# Patient Record
Sex: Female | Born: 1985 | Race: White | Hispanic: No | Marital: Married | Smoking: Never smoker
Health system: Southern US, Community
[De-identification: ages and names within clinical notes are randomized; demographics above are authoritative.]

## PROBLEM LIST (undated history)

## (undated) ENCOUNTER — Inpatient Hospital Stay (HOSPITAL_COMMUNITY): Payer: Self-pay

## (undated) DIAGNOSIS — T4145XA Adverse effect of unspecified anesthetic, initial encounter: Secondary | ICD-10-CM

## (undated) DIAGNOSIS — Z789 Other specified health status: Secondary | ICD-10-CM

## (undated) DIAGNOSIS — T8859XA Other complications of anesthesia, initial encounter: Secondary | ICD-10-CM

## (undated) HISTORY — PX: WISDOM TOOTH EXTRACTION: SHX21

---
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
## 2003-07-05 ENCOUNTER — Other Ambulatory Visit: Admission: RE | Admit: 2003-07-05 | Discharge: 2003-07-05 | Payer: Self-pay | Admitting: Family Medicine

## 2004-04-11 ENCOUNTER — Encounter: Admission: RE | Admit: 2004-04-11 | Discharge: 2004-04-11 | Payer: Self-pay | Admitting: Family Medicine

## 2005-01-09 ENCOUNTER — Other Ambulatory Visit: Admission: RE | Admit: 2005-01-09 | Discharge: 2005-01-09 | Payer: Self-pay | Admitting: Family Medicine

## 2006-02-01 ENCOUNTER — Other Ambulatory Visit: Admission: RE | Admit: 2006-02-01 | Discharge: 2006-02-01 | Payer: Self-pay | Admitting: Family Medicine

## 2007-02-11 ENCOUNTER — Other Ambulatory Visit: Admission: RE | Admit: 2007-02-11 | Discharge: 2007-02-11 | Payer: Self-pay | Admitting: Family Medicine

## 2007-03-19 ENCOUNTER — Encounter: Admission: RE | Admit: 2007-03-19 | Discharge: 2007-03-19 | Payer: Self-pay | Admitting: Family Medicine

## 2008-02-19 ENCOUNTER — Other Ambulatory Visit: Admission: RE | Admit: 2008-02-19 | Discharge: 2008-02-19 | Payer: Self-pay | Admitting: Family Medicine

## 2008-11-03 ENCOUNTER — Inpatient Hospital Stay (HOSPITAL_COMMUNITY): Admission: AD | Admit: 2008-11-03 | Discharge: 2008-11-06 | Payer: Self-pay | Admitting: Obstetrics and Gynecology

## 2008-11-04 ENCOUNTER — Encounter (INDEPENDENT_AMBULATORY_CARE_PROVIDER_SITE_OTHER): Payer: Self-pay | Admitting: Obstetrics and Gynecology

## 2010-12-26 LAB — DIFFERENTIAL
Basophils Absolute: 0.1 10*3/uL (ref 0.0–0.1)
Lymphocytes Relative: 20 % (ref 12–46)
Neutro Abs: 13.6 10*3/uL — ABNORMAL HIGH (ref 1.7–7.7)
Neutrophils Relative %: 75 % (ref 43–77)

## 2010-12-26 LAB — CBC
Platelets: 198 10*3/uL (ref 150–400)
RDW: 12.8 % (ref 11.5–15.5)

## 2010-12-26 LAB — RPR: RPR Ser Ql: NONREACTIVE

## 2012-10-03 ENCOUNTER — Inpatient Hospital Stay (HOSPITAL_COMMUNITY)
Admission: AD | Admit: 2012-10-03 | Discharge: 2012-10-03 | Disposition: A | Discharge status: Home / Self Care | Payer: Medicaid Other | Source: Ambulatory Visit | Attending: Obstetrics & Gynecology | Admitting: Obstetrics & Gynecology

## 2012-10-03 ENCOUNTER — Encounter (HOSPITAL_COMMUNITY): Payer: Self-pay | Admitting: *Deleted

## 2012-10-03 ENCOUNTER — Inpatient Hospital Stay (HOSPITAL_COMMUNITY): Payer: Medicaid Other

## 2012-10-03 DIAGNOSIS — O039 Complete or unspecified spontaneous abortion without complication: Secondary | ICD-10-CM

## 2012-10-03 DIAGNOSIS — O200 Threatened abortion: Secondary | ICD-10-CM | POA: Insufficient documentation

## 2012-10-03 LAB — CBC
MCH: 29.6 pg (ref 26.0–34.0)
Platelets: 396 10*3/uL (ref 150–400)
RBC: 4.19 MIL/uL (ref 3.87–5.11)
WBC: 18.4 10*3/uL — ABNORMAL HIGH (ref 4.0–10.5)

## 2012-10-03 MED ORDER — LACTATED RINGERS IV SOLN
INTRAVENOUS | Status: DC
  Administered 2012-10-03: 15:00:00 via INTRAVENOUS

## 2012-10-03 MED ORDER — LACTATED RINGERS IV SOLN: INTRAVENOUS | Status: DC

## 2012-10-03 MED ORDER — PROMETHAZINE HCL 25 MG/ML IJ SOLN
12.5000 mg | Freq: Once | INTRAMUSCULAR | Status: AC
  Administered 2012-10-03: 12.5 mg via INTRAVENOUS
  Filled 2012-10-03: qty 1

## 2012-10-03 NOTE — MAU Note (Signed)
Pt states miscarriage began yesterday morning, was able to sleep a small amount. Many clots noted both small and large. Hx prior miscarriage. No pain at present. Pain with passing clots only. Had syncopal episode at home and husband brought pt here, EMS was leaving and was able to bring pt via stretcher into MAU room 1.

## 2012-10-03 NOTE — MAU Provider Note (Signed)
History     CSN: [id number]  Arrival date and time: 10/03/12 1304   First Provider Initiated Contact with Patient 10/03/12 1416      Chief Complaint  Patient presents with  . Miscarriage  . Syncope    HPI This is a 27 y.o. female at [redacted]w[redacted]d by LMP who presents via EMS with C/O heavy bleeding with clots starting yesterday. Today had syncopal episode and weakness. Some cramping. Has had a miscarriage before and it was like this. Has had two other full term deliveries. Feels weak and dizzy when sits up.   RN Note: Pt states miscarriage began yesterday morning, was able to sleep a small amount. Many clots noted both small and large. Hx prior miscarriage. No pain at present. Pain with passing clots only. Had syncopal episode at home and husband brought pt here, EMS was leaving and was able to bring pt via stretcher into MAU room 1.       OB History    Grav Para Term Preterm Abortions TAB SAB Ect Mult Living   4 2 2  0 1 0 1 0 0 2      No past medical history on file.  No past surgical history on file.  No family history on file.  History  Substance Use Topics  . Smoking status: Not on file  . Smokeless tobacco: Not on file  . Alcohol Use: Not on file    Allergies:  Allergies  Allergen Reactions  . Amoxil (Amoxicillin) Rash    Prescriptions prior to admission  Medication Sig Dispense Refill  . ALFALFA PO Take 1 tablet by mouth daily.      Marland Kitchen GRAPE SEED CR PO Take 1 tablet by mouth daily.        Review of Systems  Constitutional: Positive for malaise/fatigue. Negative for fever and chills.  Eyes: Negative for blurred vision.  Cardiovascular: Negative for chest pain.  Gastrointestinal: Positive for nausea, vomiting and abdominal pain. Negative for diarrhea and constipation.  Genitourinary:       Heavy vaginal bleeding   Neurological: Positive for weakness. Negative for headaches.   Physical Exam   Blood pressure 117/84, pulse 133, temperature 98 F (36.7 C),  temperature source Oral, resp. rate 18, height 5' 3.5" (1.613 m), weight 63.504 kg (140 lb), last menstrual period 07/18/2012.  Physical Exam  Constitutional: She is oriented to person, place, and time. She appears well-developed and well-nourished. No distress.  Cardiovascular: Normal rate.   Respiratory: Effort normal.  GI: Soft. She exhibits no distension and no mass. There is tenderness. There is no rebound and no guarding.  Musculoskeletal: Normal range of motion. She exhibits no edema.  Neurological: She is alert and oriented to person, place, and time.  Skin: Skin is warm and dry.  Psychiatric: She has a normal mood and affect.    MAU Course  Procedures  MDM Labs ordered, as was IV.  Patient felt nauseated and dry-heaved. At that time she felt faint and her pulse ox said her heart rate was 42.  When I auscultated her heart, rate was 100s. Skin is pale and hands are cold, suspect poor perfusion. Will defer pelvic exam until after IVF infused. Will get 12 lead EKG to be sure about heart rate. >>  Exam:  Clot in cervix, pulled some out but unable tofully evacuate. No active bleeding. US showed:    Assessment and Plan  A:  Pregnancy at 11 weeks      SAB in progress,  clot in cervix  P:  Discussed with Dr Collene Mares until patient can tolerate sitting up      May consider/offer Cytotec to fully evacuate clots      Report to S. Chase Picket NP   Wynelle Bourgeois 10/03/2012, 2:34 PM

## 2014-02-22 ENCOUNTER — Encounter (HOSPITAL_COMMUNITY): Payer: Self-pay | Admitting: *Deleted

## 2014-02-22 ENCOUNTER — Inpatient Hospital Stay (HOSPITAL_COMMUNITY)
Admission: AD | Admit: 2014-02-22 | Discharge: 2014-02-22 | Disposition: A | Discharge status: Home / Self Care | Payer: Medicaid Other | Source: Ambulatory Visit | Attending: Obstetrics & Gynecology | Admitting: Obstetrics & Gynecology

## 2014-02-22 DIAGNOSIS — O9989 Other specified diseases and conditions complicating pregnancy, childbirth and the puerperium: Principal | ICD-10-CM

## 2014-02-22 DIAGNOSIS — R109 Unspecified abdominal pain: Secondary | ICD-10-CM | POA: Insufficient documentation

## 2014-02-22 DIAGNOSIS — O99891 Other specified diseases and conditions complicating pregnancy: Secondary | ICD-10-CM | POA: Insufficient documentation

## 2014-02-22 DIAGNOSIS — O26899 Other specified pregnancy related conditions, unspecified trimester: Secondary | ICD-10-CM

## 2014-02-22 HISTORY — DX: Other complications of anesthesia, initial encounter: T88.59XA

## 2014-02-22 HISTORY — DX: Adverse effect of unspecified anesthetic, initial encounter: T41.45XA

## 2014-02-22 HISTORY — DX: Other specified health status: Z78.9

## 2014-02-22 LAB — URINALYSIS, ROUTINE W REFLEX MICROSCOPIC
BILIRUBIN URINE: NEGATIVE
GLUCOSE, UA: NEGATIVE mg/dL
Ketones, ur: NEGATIVE mg/dL
Nitrite: NEGATIVE
PROTEIN: NEGATIVE mg/dL
Specific Gravity, Urine: <1.005 — ABNORMAL LOW (ref 1.005–1.030)
UROBILINOGEN UA: 0.2 mg/dL (ref 0.0–1.0)
pH: 5.5 (ref 5.0–8.0)

## 2014-02-22 LAB — URINE MICROSCOPIC-ADD ON

## 2014-02-22 NOTE — Discharge Instructions (Signed)
Abdominal Pain During Pregnancy °Abdominal pain is common in pregnancy. Most of the time, it does not cause harm. There are many causes of abdominal pain. Some causes are more serious than others. Some of the causes of abdominal pain in pregnancy are easily diagnosed. Occasionally, the diagnosis takes time to understand. Other times, the cause is not determined. Abdominal pain can be a sign that something is very wrong with the pregnancy, or the pain may have nothing to do with the pregnancy at all. For this reason, always tell your health care provider if you have any abdominal discomfort. °HOME CARE INSTRUCTIONS  °Monitor your abdominal pain for any changes. The following actions may help to alleviate any discomfort you are experiencing: °· Do not have sexual intercourse or put anything in your vagina until your symptoms go away completely. °· Get plenty of rest until your pain improves. °· Drink clear fluids if you feel nauseous. Avoid solid food as long as you are uncomfortable or nauseous. °· Only take over-the-counter or prescription medicine as directed by your health care provider. °· Keep all follow-up appointments with your health care provider. °SEEK IMMEDIATE MEDICAL CARE IF: °· You are bleeding, leaking fluid, or passing tissue from the vagina. °· You have increasing pain or cramping. °· You have persistent vomiting. °· You have painful or bloody urination. °· You have a fever. °· You notice a decrease in your baby's movements. °· You have extreme weakness or feel faint. °· You have shortness of breath, with or without abdominal pain. °· You develop a severe headache with abdominal pain. °· You have abnormal vaginal discharge with abdominal pain. °· You have persistent diarrhea. °· You have abdominal pain that continues even after rest, or gets worse. °MAKE SURE YOU:  °· Understand these instructions. °· Will watch your condition. °· Will get help right away if you are not doing well or get  worse. °Document Released: 08/27/2005 Document Revised: 06/17/2013 Document Reviewed: 03/26/2013 °Exit[employer]® Patient Information ©2014 [employer]. ° °

## 2014-02-22 NOTE — MAU Note (Signed)
Pt. Went swimming today and pt. Felt a dull ache on the rt. Lower abdomen.  She went inside and then she had a constant sharp shooting pain on her rt. Lower abdomen, she was unable to move because of the pain.  Pt. Says that the pain has eased up right now, but she can still feel it.

## 2014-02-22 NOTE — MAU Provider Note (Signed)
Chief Complaint:  Abdominal Pain   Kim Mccormick is a 28 y.o.  [phone] with IUP at 4078w6d presenting for Abdominal Pain  Pt states she was at the pool today with her kids and thinks she may have overdone it. Started out with a dull right sided pain in her side and that pain started increasing and became more sharp.  Was bad enough that she struggled to get in the car to come in.  On the way here, pain subsided and is now almost gone.  Still just has occasional pains but minor compared to earlier.    No ctx, lof, vb. +FM.     Menstrual History: OB History   Grav Para Term Preterm Abortions TAB SAB Ect Mult Living   5 2 2  0 2 0 2 0 0 2      Patient's last menstrual period was 07/28/2013.      Past Medical History  Diagnosis Date  . Complication of anesthesia   . Medical history non-contributory     Past Surgical History  Procedure Laterality Date  . Wisdom tooth extraction      No family history on file.  History  Substance Use Topics  . Smoking status: Never Smoker   . Smokeless tobacco: Not on file  . Alcohol Use: No      Allergies  Allergen Reactions  . Amoxil [Amoxicillin] Rash    Prescriptions prior to admission  Medication Sig Dispense Refill  . ALFALFA PO Take 1 tablet by mouth daily.      Marland Kitchen. GRAPE SEED CR PO Take 1 tablet by mouth daily.        Review of Systems - Negative except for what is mentioned in HPI.  Physical Exam  Blood pressure 123/78, pulse 92, temperature 98.2 F (36.8 C), temperature source Oral, last menstrual period 07/28/2013, not currently breastfeeding. GENERAL: Well-developed, well-nourished female in no acute distress.  LUNGS: Clear to auscultation bilaterally.  HEART: Regular rate and rhythm. ABDOMEN: Soft, nontender, nondistended, gravid.  EXTREMITIES: Nontender, no edema, 2+ distal pulses. Cervical Exam: Dilatation 0cm   Effacement thick %   Station high   Presentation: cephalic FHT:  Baseline rate 130 bpm   Variability  moderate  Accelerations present   Decelerations none Contractions: quiet   Labs: No results found for this or any previous visit (from the past 24 hour(s)).  Imaging Studies:  No results found.  Assessment: Kim Mccormick is  28 y.o. [id number] at 178w6d presents with Abdominal Pain .  Plan:  - abdominal pain now almost completely resolved.  - likely more msk in the setting of overexertion and now with improvement after completion of activity - UA pending - pt comfortable with no further workup.  - cervix closed/thick/high - discussed reasons to return including return of pain or worsening.    F/u as already scheduled with OB in [place].    Kim Mccormick 6/15/201510:50 PM

## 2014-02-22 NOTE — MAU Provider Note (Signed)
Attestation of Attending Supervision of Advanced Practitioner (CNM/NP): Evaluation and management procedures were performed by the Advanced Practitioner under my supervision and collaboration.  I have reviewed the Advanced Practitioner's note and chart, and I agree with the management and plan.  HARRAWAY-SMITH, Shakhia Gramajo 11:08 PM

## 2014-07-12 ENCOUNTER — Encounter (HOSPITAL_COMMUNITY): Payer: Self-pay | Admitting: *Deleted

## 2014-12-28 ENCOUNTER — Encounter (HOSPITAL_COMMUNITY): Payer: Self-pay | Admitting: *Deleted

## 2017-09-10 DIAGNOSIS — N200 Calculus of kidney: Secondary | ICD-10-CM

## 2017-09-10 HISTORY — DX: Calculus of kidney: N20.0

## 2018-05-20 ENCOUNTER — Ambulatory Visit
Admission: RE | Admit: 2018-05-20 | Discharge: 2018-05-20 | Disposition: A | Discharge status: Home / Self Care | Payer: Medicaid Other | Source: Ambulatory Visit | Attending: Family Medicine | Admitting: Family Medicine

## 2018-05-20 ENCOUNTER — Other Ambulatory Visit: Payer: Self-pay | Admitting: Family Medicine

## 2018-05-20 DIAGNOSIS — M25541 Pain in joints of right hand: Secondary | ICD-10-CM

## 2021-05-09 ENCOUNTER — Encounter: Payer: Self-pay | Admitting: Gastroenterology

## 2021-06-13 ENCOUNTER — Encounter: Payer: Self-pay | Admitting: Gastroenterology

## 2021-06-13 ENCOUNTER — Ambulatory Visit: Payer: Medicaid Other | Admitting: Gastroenterology

## 2021-06-13 VITALS — BP 100/80 | HR 94 | Ht 63.0 in | Wt 159.0 lb

## 2021-06-13 DIAGNOSIS — L290 Pruritus ani: Secondary | ICD-10-CM

## 2021-06-13 DIAGNOSIS — K625 Hemorrhage of anus and rectum: Secondary | ICD-10-CM

## 2021-06-13 NOTE — Progress Notes (Signed)
Called Dr. Wendee Copp office to request most recent labs be faxed to our office. LVM with Marylene Land with our fax # to fax requested records as well as our office phone # should she have any further questions.

## 2021-06-13 NOTE — Patient Instructions (Addendum)
It was my pleasure to provide care to you today. Based on our discussion, I am providing you with my recommendations below:  RECOMMENDATION(S):   I am sending you with samples of Recticare. You may purchase this products Over the Counter  * Keep your bottom clean * Avoid moisture in the anal area * Apply zinc oxide to the anal area * Avoid tight fitting clothes * Wear cotton underpants * Avoid coffee, cola, beer, tomatoes, chocolate, tea, and citrus fruits as these can exacerbate anal itching   I have recommended a colonoscopy for further evaluation  COLONOSCOPY:   You have been scheduled for a colonoscopy. Please follow written instructions given to you at your visit today.   PREP:   Please pick up your prep supplies at the pharmacy within the next 1-3 days.  INHALERS:   If you use inhalers (even only as needed), please bring them with you on the day of your procedure.  COLONOSCOPY TIPS:  To reduce nausea and dehydration, stay well hydrated for 3-4 days prior to the exam.  To prevent skin/hemorrhoid irritation - prior to wiping, put A&Dointment or vaseline on the toilet paper. Keep a towel or pad on the bed.  BEFORE STARTING YOUR PREP, drink  64oz of clear liquids in the morning. This will help to flush the colon and will ensure you are well hydrated!!!!  NOTE - This is in addition to the fluids required for to complete your prep. Use of a flavored hard candy, such as grape Rubin Payor, can counteract some of the flavor of the prep and may prevent some nausea.    FOLLOW UP:  After your procedure, you will receive a call from my office staff regarding my recommendation for follow up.  BMI:  If you are age 57 or younger, your body mass index should be between 19-25. Your Body mass index is 28.17 kg/m. If this is out of the aformentioned range listed, please consider follow up with your Primary Care Provider.   MY CHART:  The [facility] providers would like to encourage  you to use [facility] to communicate with providers for non-urgent requests or questions.  Due to long hold times on the telephone, sending your provider a message by [facility] may be a faster and more efficient way to get a response.  Please allow 48 business hours for a response.  Please remember that this is for non-urgent requests.   Thank you for trusting me with your gastrointestinal care!    Tressia Danas, MD, MPH

## 2021-06-13 NOTE — Progress Notes (Addendum)
Referring Provider: Dois Davenport, MD Primary Care Physician:  Dois Davenport, MD  Reason for Consultation:  anal fissure, hemorrhoids, and pruritus ani   IMPRESSION:  Rectal itching and discomfort Rectal skin tag Symptoms of rectal prolapse  PLAN: - Recticare - Obtain labs from Dr. Hal Hope, if not performed, will recommend CBC with diff (to evaluate for evidence of malignancy, myeloproliferative disease, or iron deficiency); serum bilirubin, transaminases, and alkaline phosphatase (to evaluate for evidence of liver disease), thyroid-stimulating hormone (to evaluate for evidence of a thyroid disorder), Blood urea nitrogen (BUN) and creatinine (to evaluate for renal disease) - Recommendations for symptoms include:         * Keep your bottom clean * Avoid moisture in the anal area * Apply zinc oxide to the anal area * Avoid tight fitting clothes * Wear cotton underpants * Avoid coffee, cola, beer, tomatoes, chocolate, tea, and citrus fruits as these can exacerbate anal itching  - Diltiazem with lidocaine if needed for recurrent fissure symptoms - Colonoscopy for better evaluation of anal canal, rectum, and for active colonic disease - Low threshold to consider surgical referral DG:LOVFIEPPI rectal prolapse  Addendum: Labs from 11/03/20 show normal CBC except for hematocrit 46.8. Normal CMP. HgbA1C 5.6. TSH 2.08. Low vitamin D at 21.1. No additional lab testing recommended at this time.   HPI: Kim Mccormick is a 35 y.o. female referred by Dr. Hal Hope for anal fissure, hemorrhoids, and pruritus ani. The history is obtained through the patient and review of her electronic health record. She is a stay at home Mom.   GI symptoms as a child.  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 Experienced.GERD during pregnancy.  Reports an upper endoscopy around 1991 showing "inflamed mucosal folds" treated with  H2blockers.   She presents today reporting rectal discomfort and itching not responding to OTC therapies, local Nitroglycerin and rectal suppositories. Has BRBPR after bowel movements.    Symptoms started with first of five vaginal pregnancy in 2009. Required Zofran during pregnancy that resulted in severe constipation and ultimately symptomatic hemorrhoids. Developed a fissure during her third pregnancy in 2013. No anorectal symptoms between pregnancies until her last pregnancy in 2020 when she had profoundly symptomatic hemorrhoids. Asymptomatic until February when symptoms recurred. Not responding to empiric antifungal treatment with tea tree oil. Prefers to avoid medications.   She did not tolerate treatment with Nitroglycerin due to headaches.   She now feels a flap that she feels fills up with a hemorrhoid. Some rectal discomfort when she sits or stand.   Has normal formed bowel movements once or twice daily without straining. No mucous.  No constipation or diarrhea. No fecal soiling. However, she frequently feels like her colon lining needs to be put back into place.   Maternal aunt with colon cancer. No other family history of colon cancer or polyps.   Past Medical History:  Diagnosis Date   Complication of anesthesia    Medical history non-contributory     Past Surgical History:  Procedure Laterality Date   WISDOM TOOTH EXTRACTION      Prior to Admission medications   Medication Sig Start Date End Date Taking? Authorizing Provider  NON FORMULARY Boiron supp as needed   Yes [provider]  NON FORMULARY Hemp cream applied topically to rectum as needed   Yes [provider]  ofloxacin (FLOXIN) 0.3 % OTIC solution SMARTSIG:In Ear(s) 06/10/21  Yes [provider]    Current Outpatient Medications  Medication Sig Dispense Refill   NON FORMULARY Boiron supp as needed     NON FORMULARY Hemp cream applied topically to rectum as needed     ofloxacin  (FLOXIN) 0.3 % OTIC solution SMARTSIG:In Ear(s)     No current facility-administered medications for this visit.    Allergies as of 06/13/2021 - Review Complete 06/13/2021  Allergen Reaction Noted   Amoxil [amoxicillin] Rash 10/03/2012    Family History  Problem Relation Age of Onset   Colon cancer Maternal Aunt    Stomach cancer Neg Hx    Pancreatic cancer Neg Hx      Review of Systems: 12 system ROS is negative except as noted above wit the addition of allergies, hematuria with kidney stones, and fatigue. She has had multiple illnesses over the last year including a GI bug in January, Mono in the spring, strep in the spring, possible covid in August, and pneumonia in August.    Physical Exam: General:   Alert,  well-nourished, pleasant and cooperative in NAD Head:  Normocephalic and atraumatic. Eyes:  Sclera clear, no icterus.   Conjunctiva pink. Abdomen:  Soft, nontender, nondistended, normal bowel sounds, no rebound or guarding. No hepatosplenomegaly.   Rectal:   No chemical dermatitis. Posterior skin tag. Internal hemorrhoids present. No external hemorrhoids, fissure or fistula. No rectal prolapse. Normal anocutaneous reflex. No stool in the rectal vault. No mass or fecal impaction. Normal anal resting tone.  Anoscopy performed and showed internal hemorrhoids. Chaperone: Ammie Msk:  Symmetrical. No boney deformities LAD: No inguinal or umbilical LAD Extremities:  No clubbing or edema. Neurologic:  Alert and  oriented x4;  grossly nonfocal Skin:  Intact without significant lesions or rashes. Psych:  Alert and cooperative. Normal mood and affect.    Racer Quam L. Orvan Falconer, MD, MPH 06/13/2021, 10:05 AM

## 2021-06-13 NOTE — Progress Notes (Signed)
Lab results received and given to Dr. Orvan Falconer for her review.

## 2021-06-14 ENCOUNTER — Telehealth: Payer: Self-pay

## 2021-06-14 NOTE — Telephone Encounter (Signed)
-----   Message from Tressia Danas, MD sent at 06/13/2021  9:41 PM EDT ----- Recent labs reviewed. Please let the patient know that no further labs are recommended at this time. Thanks.  KLB

## 2021-06-14 NOTE — Telephone Encounter (Signed)
Called pt to inform that Dr. Orvan Falconer has reviewed all labs received from Dr. Wendee Copp office. At this time no further labs are required. LVM requesting returned call.

## 2021-06-15 NOTE — Telephone Encounter (Signed)
Patient returned your call.

## 2021-06-15 NOTE — Telephone Encounter (Signed)
SECOND ATTEMPT: ° °LVM requesting returned call. °

## 2021-06-15 NOTE — Telephone Encounter (Signed)
FINAL ATTEMPT:  Returned pt call. LVM requesting returned call.

## 2021-06-16 NOTE — Telephone Encounter (Signed)
Pt returned call. Advised labs received from Dr. Hal Hope were reviewed by Dr. Orvan Falconer. She is satisfied with the results she received and advised there were no further labs required. Will proceed with procedure as planned. Verbalized acceptance and understanding.

## 2021-06-23 ENCOUNTER — Encounter: Payer: Self-pay | Admitting: Gastroenterology

## 2021-06-23 ENCOUNTER — Ambulatory Visit (AMBULATORY_SURGERY_CENTER): Payer: Medicaid Other | Admitting: Gastroenterology

## 2021-06-23 VITALS — BP 116/85 | HR 74 | Temp 98.0°F | Resp 16 | Ht 63.0 in | Wt 159.0 lb

## 2021-06-23 DIAGNOSIS — K5000 Crohn's disease of small intestine without complications: Secondary | ICD-10-CM

## 2021-06-23 DIAGNOSIS — K529 Noninfective gastroenteritis and colitis, unspecified: Secondary | ICD-10-CM | POA: Diagnosis not present

## 2021-06-23 DIAGNOSIS — K648 Other hemorrhoids: Secondary | ICD-10-CM | POA: Diagnosis not present

## 2021-06-23 DIAGNOSIS — L290 Pruritus ani: Secondary | ICD-10-CM

## 2021-06-23 DIAGNOSIS — K50011 Crohn's disease of small intestine with rectal bleeding: Secondary | ICD-10-CM | POA: Diagnosis not present

## 2021-06-23 DIAGNOSIS — K625 Hemorrhage of anus and rectum: Secondary | ICD-10-CM

## 2021-06-23 MED ORDER — SODIUM CHLORIDE 0.9 % IV SOLN: 500.0000 mL | Freq: Once | INTRAVENOUS | Status: AC

## 2021-06-23 NOTE — Patient Instructions (Addendum)
Read all of the handouts given to you by your recovery room nurse. Avoid NSAIDS whenever possible.  YOU HAD AN ENDOSCOPIC PROCEDURE TODAY AT THE [facility]:   Refer to the procedure report that was given to you for any specific questions about what was found during the examination.  If the procedure report does not answer your questions, please call your gastroenterologist to clarify.  If you requested that your care partner not be given the details of your procedure findings, then the procedure report has been included in a sealed envelope for you to review at your convenience later.  YOU SHOULD EXPECT: Some feelings of bloating in the abdomen. Passage of more gas than usual.  Walking can help get rid of the air that was put into your GI tract during the procedure and reduce the bloating. If you had a lower endoscopy (such as a colonoscopy or flexible sigmoidoscopy) you may notice spotting of blood in your stool or on the toilet paper. If you underwent a bowel prep for your procedure, you may not have a normal bowel movement for a few days.  Please Note:  You might notice some irritation and congestion in your nose or some drainage.  This is from the oxygen used during your procedure.  There is no need for concern and it should clear up in a day or so.  SYMPTOMS TO REPORT IMMEDIATELY:  Following lower endoscopy (colonoscopy or flexible sigmoidoscopy):  Excessive amounts of blood in the stool  Significant tenderness or worsening of abdominal pains  Swelling of the abdomen that is new, acute  Fever of 100F or higher      For urgent or emergent issues, a gastroenterologist can be reached at any hour by calling [phone]. Do not use MyChart messaging for urgent concerns.    DIET:  We do recommend a small meal at first, but then you may proceed to your regular diet.  Drink plenty of fluids but you should avoid alcoholic beverages for 24 hours.  ACTIVITY:  You should plan to  take it easy for the rest of today and you should NOT DRIVE or use heavy machinery until tomorrow (because of the sedation medicines used during the test).    FOLLOW UP: Our staff will call the number listed on your records 48-72 hours following your procedure to check on you and address any questions or concerns that you may have regarding the information given to you following your procedure. If we do not reach you, we will leave a message.  We will attempt to reach you two times.  During this call, we will ask if you have developed any symptoms of COVID 19. If you develop any symptoms (ie: fever, flu-like symptoms, shortness of breath, cough etc.) before then, please call [phone].  If you test positive for Covid 19 in the 2 weeks post procedure, please call and report this information to [place].    If any biopsies were taken you will be contacted by phone or by letter within the next 1-3 weeks.  Please call us at [phone] if you have not heard about the biopsies in 3 weeks.    SIGNATURES/CONFIDENTIALITY: You and/or your care partner have signed paperwork which will be entered into your electronic medical record.  These signatures attest to the fact that that the information above on your After Visit Summary has been reviewed and is understood.  Full responsibility of the confidentiality of this discharge information lies with you and/or  your care-partner.

## 2021-06-23 NOTE — Op Note (Signed)
[facility] Patient Name: Kim Mccormick Procedure Date: 06/23/2021 10:31 AM MRN: [id number] Endoscopist: Tressia Danas MD, MD Age: 35 Referring MD:  Date of Birth: August 06, 1986 Gender: Female Account #: [id number] Procedure:                Colonoscopy Indications:              Rectal itching and discomfort                           Symptoms of rectal prolapse Medicines:                Monitored Anesthesia Care Procedure:                Pre-Anesthesia Assessment:                           - Prior to the procedure, a History and Physical                            was performed, and patient medications and                            allergies were reviewed. The patient's tolerance of                            previous anesthesia was also reviewed. The risks                            and benefits of the procedure and the sedation                            options and risks were discussed with the patient.                            All questions were answered, and informed consent                            was obtained. Prior Anticoagulants: The patient has                            taken no previous anticoagulant or antiplatelet                            agents. ASA Grade Assessment: II - A patient with                            mild systemic disease. After reviewing the risks                            and benefits, the patient was deemed in                            satisfactory condition to undergo the procedure.  After obtaining informed consent, the colonoscope                            was passed under direct vision. Throughout the                            procedure, the patient's blood pressure, pulse, and                            oxygen saturations were monitored continuously. The                            Olympus PCF-H190DL (#[id number]) Colonoscope was                            introduced through the anus and advanced to the 8                             cm into the ileum. The colonoscopy was performed                            without difficulty. The patient tolerated the                            procedure well. The quality of the bowel                            preparation was good. The terminal ileum, ileocecal                            valve, appendiceal orifice, and rectum were                            photographed. Scope In: 11:03:21 AM Scope Out: 11:18:24 AM Scope Withdrawal Time: 0 hours 12 minutes 5 seconds  Total Procedure Duration: 0 hours 15 minutes 3 seconds  Findings:                 Skin tags were found on perianal exam.                           A segmental area of mildly altered vascular and                            erythematous mucosa was found in the sigmoid colon,                            in the distal descending colon, in the proximal                            ascending colon and in the cecum. The remainder of                            the examined colonic mucosa between these areas  including the rectum appeared normal. Biopsies were                            taken with a cold forceps for histology. Estimated                            blood loss was minimal.                           Non-bleeding internal hemorrhoids were found.                           One focal area of mucosa in the distal ileum was                            mildly erythematous. No additional mucosal                            abnormalities seen in the distal terminal ileum.                            Biopsies were taken with a cold forceps for                            histology. Estimated blood loss was minimal.                           The exam was otherwise without abnormality on                            direct and retroflexion views. Complications:            No immediate complications. Estimated blood loss:                            Minimal. Estimated Blood Loss:      Estimated blood loss was minimal. Impression:               - Perianal skin tags found on perianal exam.                           - Altered vascular and erythematous mucosa in the                            sigmoid colon, in the distal descending colon, in                            the proximal ascending colon and in the cecum.                            Biopsied.                           - Non-bleeding internal hemorrhoids.                           -  Focally erythematous mucosa in the distal ileum.                            Biopsied.                           - The examination was otherwise normal on direct                            and retroflexion views. Recommendation:           - Patient has a contact number available for                            emergencies. The signs and symptoms of potential                            delayed complications were discussed with the                            patient. Return to normal activities tomorrow.                            Written discharge instructions were provided to the                            patient.                           - Resume previous diet.                           - Continue present medications.                           - Avoid all NSAIDs.                           - Await pathology results.                           - Repeat colonoscopy in 10 years for screening                            purposes.                           - Emerging evidence supports eating a diet of                            fruits, vegetables, grains, calcium, and yogurt                            while reducing red meat and alcohol may reduce the                            risk of colon cancer. Tressia Danas MD, MD  06/23/2021 11:28:45 AM This report has been signed electronically.

## 2021-06-23 NOTE — Progress Notes (Signed)
   Referring Provider: Dois Davenport, MD Primary Care Physician:  Dois Davenport, MD  Indication for procedure:  anal fissure, hemorrhoids, and pruritus ani   IMPRESSION:  Rectal itching and discomfort Rectal skin tag Symptoms of rectal prolapse Appropriate candidate for monitored anesthesia care in the [facility]  PLAN: Colonoscopy for better evaluation of anal canal, rectum, and for active colonic disease   HPI: Kim Mccormick is a 35 y.o. female who presents for endoscopic evaluation of anal itching and discomfort. Please see my office note from 06/13/21 for full details. There has been no significant change in history or physical exam.  Past Medical History:  Diagnosis Date   Complication of anesthesia    Medical history non-contributory     Past Surgical History:  Procedure Laterality Date   WISDOM TOOTH EXTRACTION       Current Outpatient Medications  Medication Sig Dispense Refill   NON FORMULARY Boiron supp as needed     NON FORMULARY Hemp cream applied topically to rectum as needed     ofloxacin (FLOXIN) 0.3 % OTIC solution SMARTSIG:In Ear(s)     Current Facility-Administered Medications  Medication Dose Route Frequency Provider Last Rate Last Admin   0.9 %  sodium chloride infusion  500 mL Intravenous Once Tressia Danas, MD        Allergies as of 06/23/2021 - Review Complete 06/23/2021  Allergen Reaction Noted   Amoxil [amoxicillin] Rash 10/03/2012    Family History  Problem Relation Age of Onset   Colon cancer Maternal Aunt    Stomach cancer Neg Hx    Pancreatic cancer Neg Hx       Physical Exam: General:   Alert,  well-nourished, pleasant and cooperative in NAD Head:  Normocephalic and atraumatic. Eyes:  Sclera clear, no icterus.   Conjunctiva pink. Abdomen:  Soft, nontender, nondistended, normal bowel sounds, no rebound or guarding. No hepatosplenomegaly.   Rectal:   No chemical dermatitis. Posterior skin tag. Internal hemorrhoids present.  No external hemorrhoids, fissure or fistula. No rectal prolapse. Normal anocutaneous reflex. No stool in the rectal vault. No mass or fecal impaction. Normal anal resting tone.  Anoscopy performed and showed internal hemorrhoids. Chaperone: Ammie Msk:  Symmetrical. No boney deformities LAD: No inguinal or umbilical LAD Extremities:  No clubbing or edema. Neurologic:  Alert and  oriented x4;  grossly nonfocal Skin:  Intact without significant lesions or rashes. Psych:  Alert and cooperative. Normal mood and affect.    Jeramey Lanuza L. Orvan Falconer, MD, MPH 06/23/2021, 10:47 AM

## 2021-06-23 NOTE — Progress Notes (Signed)
Pt Drowsy. VSS. To PACU, report to RN. No anesthetic complications noted.  

## 2021-06-23 NOTE — Progress Notes (Signed)
Called to room to assist during endoscopic procedure.  Patient ID and intended procedure confirmed with present staff. Received instructions for my participation in the procedure from the performing physician.  

## 2021-06-27 ENCOUNTER — Telehealth: Payer: Self-pay | Admitting: *Deleted

## 2021-06-27 ENCOUNTER — Telehealth: Payer: Self-pay

## 2021-06-27 NOTE — Telephone Encounter (Signed)
  Follow up Call-  Call back number 06/23/2021  Post procedure Call Back phone  # [phone]  Permission to leave phone message Yes  Some recent data might be hidden     Patient questions:  Do you have a fever, pain , or abdominal swelling? No. Pain Score  0 *  Have you tolerated food without any problems? Yes.    Have you been able to return to your normal activities? Yes.    Do you have any questions about your discharge instructions: Diet   No. Medications  No. Follow up visit  No.  Do you have questions or concerns about your Care? No.  Actions: * If pain score is 4 or above: No action needed, pain <4.  Have you developed a fever since your procedure? no  2.   Have you had an respiratory symptoms (SOB or cough) since your procedure? no  3.   Have you tested positive for COVID 19 since your procedure no  4.   Have you had any family members/close contacts diagnosed with the COVID 19 since your procedure?  no   If yes to any of these questions please route to Laverna Peace, RN and Karlton Lemon, RN

## 2021-06-27 NOTE — Telephone Encounter (Signed)
Attempted f/u call back. No answer, left VM. 

## 2021-07-04 ENCOUNTER — Other Ambulatory Visit: Payer: Self-pay

## 2021-07-04 DIAGNOSIS — R197 Diarrhea, unspecified: Secondary | ICD-10-CM

## 2021-07-06 ENCOUNTER — Other Ambulatory Visit: Payer: Medicaid Other

## 2021-07-06 DIAGNOSIS — R197 Diarrhea, unspecified: Secondary | ICD-10-CM

## 2021-07-10 ENCOUNTER — Telehealth: Payer: Self-pay | Admitting: Gastroenterology

## 2021-07-10 NOTE — Telephone Encounter (Signed)
[facility] called with an alert on patients results.

## 2021-07-10 NOTE — Telephone Encounter (Signed)
Shiga-toxin-producing E coli Not Detected Detected Abnormal    Comment:                   Client Requested Flag   Please review and advise. Fecal Calprotectin still pending

## 2021-07-11 NOTE — Telephone Encounter (Signed)
Your GI stool pathogen panel is positive for shiga toxin-producing E coli (STEC). STEC occurs when you eat a food contaminated with the bacteria, particularly raw or undercooked meat. But, infection can also occur after eating any product contaminated with the bacteria, including lettuce, alfalfa sprouts, salami, raw milk, juice, and cider. Infected people can spread the infection to other people if they don't wash their hands after using the toilet.   My best advice to treat your STEC is to drink plenty of fluids to stay hydrated. Antibiotics are not use for this infection as they aren't helpful and ultimately increase the risk for complications.    Please call the office at your convenience to schedule a follow-up appointment.  Written by Tressia Danas, MD on 07/10/2021  4:50 PM EDT Seen by patient Kim Mccormick on 07/10/2021  6:03 PM

## 2021-07-12 LAB — GI PROFILE, STOOL, PCR
Adenovirus F 40/41: NOT DETECTED
Astrovirus: NOT DETECTED
C difficile toxin A/B: NOT DETECTED
Campylobacter: NOT DETECTED
Cryptosporidium: NOT DETECTED
Cyclospora cayetanensis: NOT DETECTED
E coli O157: NOT DETECTED
Entamoeba histolytica: NOT DETECTED
Enteroaggregative E coli: NOT DETECTED
Enterotoxigenic E coli: NOT DETECTED
Giardia lamblia: NOT DETECTED
Norovirus GI/GII: NOT DETECTED
Plesiomonas shigelloides: NOT DETECTED
Rotavirus A: NOT DETECTED
Salmonella: NOT DETECTED
Sapovirus: NOT DETECTED
Shiga-toxin-producing E coli: DETECTED — AB
Shigella/Enteroinvasive E coli: NOT DETECTED
Vibrio cholerae: NOT DETECTED
Vibrio: NOT DETECTED
Yersinia enterocolitica: NOT DETECTED

## 2021-07-12 LAB — CALPROTECTIN, FECAL: Calprotectin, Fecal: 48 ug/g (ref 0–120)

## 2021-08-18 ENCOUNTER — Encounter: Payer: Self-pay | Admitting: Gastroenterology

## 2021-08-18 ENCOUNTER — Ambulatory Visit: Payer: Medicaid Other | Admitting: Gastroenterology

## 2021-08-18 VITALS — BP 100/72 | HR 96 | Ht 63.25 in | Wt 160.5 lb

## 2021-08-18 DIAGNOSIS — K529 Noninfective gastroenteritis and colitis, unspecified: Secondary | ICD-10-CM

## 2021-08-18 DIAGNOSIS — L290 Pruritus ani: Secondary | ICD-10-CM | POA: Diagnosis not present

## 2021-08-18 DIAGNOSIS — K51919 Ulcerative colitis, unspecified with unspecified complications: Secondary | ICD-10-CM | POA: Diagnosis not present

## 2021-08-18 MED ORDER — PENTASA 250 MG PO CPCR: 1000.0000 mg | ORAL_CAPSULE | Freq: Four times a day (QID) | ORAL | 3 refills | Status: DC

## 2021-08-18 NOTE — Progress Notes (Addendum)
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 female who returns in follow-up for further evaluation of anal fissure, hemorrhoids, and pruritus ani.   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 Experienced.GERD during pregnancy.  Reports an upper endoscopy around 1991 showing "inflamed mucosal folds" treated with H2blockers.   Seen in consultation 06/13/21 for rectal discomfort and itching not responding to OTC therapies, local Nitroglycerin and rectal suppositories. Has BRBPR after bowel movements.    Symptoms started with first of five vaginal pregnancies in 2009. Required Zofran during pregnancy that resulted in severe constipation and ultimately symptomatic hemorrhoids. Developed a fissure during her third pregnancy in 2013. No anorectal symptoms between pregnancies until her last pregnancy in 2020 when she had profoundly symptomatic hemorrhoids. Asymptomatic until February when symptoms recurred. Not responding to empiric antifungal treatment with tea tree oil.  Prefers to avoid medications.   She did not tolerate treatment with Nitroglycerin due to headaches.   She now feels a flap that she feels fills up with a hemorrhoid. Some rectal discomfort when she sits or stand.   Has normal formed bowel movements once or twice daily without straining. No mucous.  No constipation or diarrhea. No fecal soiling. However, she frequently feels like her colon lining needs to be put back into place.   Urgency. No change in bowel habits. No abdominal pain.   No NSAIDs except for ibuprofen for pleurisy in October.   Colonoscopy 06/23/21: - Perianal skin tags found on perianal exam. - Altered vascular and erythematous mucosa in the sigmoid colon, in the distal descending colon, in the proximal ascending colon and in the cecum. Biopsied. - Non-bleeding internal hemorrhoids. - Focally erythematous mucosa in the distal ileum. Biopsied. - The examination was otherwise normal on direct and retroflexion views.  1. Surgical [P], colon, terminal ileum - ACUTE ILEITIS - NO GRANULOMAS, DYSPLASIA OR MALIGNANCY IDENTIFIED - SEE COMMENT 2. Surgical [P], right colon - ACTIVE COLITIS - NO GRANULOMATA, DYSPLASIA OR MALIGNANCY IDENTIFIED 3. Surgical [P], colon, distal ascending - BENIGN COLONIC MUCOSA - NO ACTIVE INFLAMMATION OR EVIDENCE OF MICROSCOPIC COLITIS - NO HIGH GRADE DYSPLASIA OR MALIGNANCY IDENTIFIED 4. Surgical [P], colon, transverse - BENIGN COLONIC MUCOSA - NO ACTIVE INFLAMMATION OR EVIDENCE OF MICROSCOPIC COLITIS - NO HIGH GRADE DYSPLASIA OR MALIGNANCY IDENTIFIED 5. Surgical [P], left colon - BENIGN COLONIC MUCOSA - NO ACTIVE INFLAMMATION OR EVIDENCE OF MICROSCOPIC COLITIS - NO HIGH GRADE DYSPLASIA OR MALIGNANCY IDENTIFIED 6. Surgical [P], colon, rectum - BENIGN COLONIC MUCOSA - NO ACTIVE INFLAMMATION OR EVIDENCE OF MICROSCOPIC COLITIS -  NO HIGH GRADE DYSPLASIA OR MALIGNANCY IDENTIFIED  GI pathogen panel + Shiga-toxin producing E coli Fecal  calprotectin 48  Returns in follow-up with persistent rectal itching and stools with significant urgency.   Maternal aunt with colon cancer. No other family history of colon cancer or polyps.   Past Medical History:  Diagnosis Date   Complication of anesthesia    Kidney stone 2019   Medical history non-contributory     Past Surgical History:  Procedure Laterality Date   ESOPHAGOGASTRODUODENOSCOPY  1993   WISDOM TOOTH EXTRACTION      Prior to Admission medications   Medication Sig Start Date End Date Taking? Authorizing Provider  NON FORMULARY Boiron supp as needed   Yes [provider]  NON FORMULARY Hemp cream applied topically to rectum as needed   Yes [provider]  ofloxacin (FLOXIN) 0.3 % OTIC solution SMARTSIG:In Ear(s) 06/10/21  Yes [provider]    Current Outpatient Medications  Medication Sig Dispense Refill   NON FORMULARY Boiron supp as needed     NON FORMULARY Hemp cream applied topically to rectum as needed     ofloxacin (FLOXIN) 0.3 % OTIC solution SMARTSIG:In Ear(s)     Current Facility-Administered Medications  Medication Dose Route Frequency Provider Last Rate Last Admin   0.9 %  sodium chloride infusion  500 mL Intravenous Once Tressia Danas, MD        Allergies as of 08/18/2021 - Review Complete 06/23/2021  Allergen Reaction Noted   Amoxil [amoxicillin] Rash 10/03/2012    Family History  Problem Relation Age of Onset   Colon cancer Maternal Aunt    Colon polyps Maternal Grandmother    Stomach cancer Neg Hx    Pancreatic cancer Neg Hx    Esophageal cancer Neg Hx    Rectal cancer Neg Hx     Physical Exam: General:   Alert,  well-nourished, pleasant and cooperative in NAD Head:  Normocephalic and atraumatic. Eyes:  Sclera clear, no icterus.   Conjunctiva pink. Abdomen:  Soft, nontender, nondistended, normal bowel sounds, no rebound or guarding. No hepatosplenomegaly.   Neurologic:  Alert and  oriented x4;   grossly nonfocal Skin:  Intact without significant lesions or rashes. Psych:  Alert and cooperative. Normal mood and affect.    Londa Mackowski L. Orvan Falconer, MD, MPH 08/18/2021, 10:04 AM

## 2021-08-18 NOTE — Patient Instructions (Addendum)
I have recommended a trial of Pentasa to treat inflammation in the small bowel and colonoscopy. Start 1g twice a daily. Increase to 2 grams twice a day after 2 weeks if you aren't noticing any difference.  Although thought to be safe with breast feeding, there are reports of babies having diarrhea when Mom is using Pentasa.   In the meantime, avoid coffee, cola, beer, tomatoes, chocolate, tea, and citrus fruits as these can exacerbate anal itching.   I'd like to see you back in the office in February, but please reach out by MyChart if you need anything before then.  It was a pleasure to see you today!  Thank you for trusting me with your gastrointestinal care!

## 2021-08-22 ENCOUNTER — Encounter: Payer: Self-pay | Admitting: Gastroenterology

## 2021-08-22 DIAGNOSIS — K51919 Ulcerative colitis, unspecified with unspecified complications: Secondary | ICD-10-CM

## 2021-08-29 ENCOUNTER — Encounter: Payer: Self-pay | Admitting: Gastroenterology

## 2021-09-01 MED ORDER — PENTASA 250 MG PO CPCR: 1000.0000 mg | ORAL_CAPSULE | Freq: Four times a day (QID) | ORAL | 2 refills | Status: DC

## 2021-10-06 ENCOUNTER — Encounter: Payer: Self-pay | Admitting: Gastroenterology

## 2021-11-06 ENCOUNTER — Ambulatory Visit: Payer: Medicaid Other | Admitting: Gastroenterology

## 2021-11-06 ENCOUNTER — Encounter: Payer: Self-pay | Admitting: Gastroenterology

## 2021-11-06 VITALS — BP 110/70 | HR 91 | Ht 63.0 in | Wt 156.0 lb

## 2021-11-06 DIAGNOSIS — L290 Pruritus ani: Secondary | ICD-10-CM | POA: Diagnosis not present

## 2021-11-06 DIAGNOSIS — K529 Noninfective gastroenteritis and colitis, unspecified: Secondary | ICD-10-CM | POA: Diagnosis not present

## 2021-11-06 DIAGNOSIS — K625 Hemorrhage of anus and rectum: Secondary | ICD-10-CM

## 2021-11-06 DIAGNOSIS — K51919 Ulcerative colitis, unspecified with unspecified complications: Secondary | ICD-10-CM | POA: Diagnosis not present

## 2021-11-06 NOTE — Patient Instructions (Addendum)
It was my pleasure to provide care to you today. Based on our discussion, I am providing you with my recommendations below:  RECOMMENDATION(S):   Continue Hempcream as needed.  Avoid coffee, cola, beer, tomatoes, chocolate, tea, and citrus fruits as these can exacerbate anal itching   LABS:   Please proceed to the basement level for lab work before leaving today. Press "B" on the elevator. The lab is located at the first door on the left as you exit the elevator.  IMAGING:  You will be contacted by [facility] Scheduling (Your caller ID will indicate phone # [phone]) within the next business 7-10 business days to schedule your MRI Pelvis. If you have not heard from them within 7-10 business days, please call [facility] Scheduling at [phone] to follow up on the status of your appointment.    FOLLOW UP:  After your MRI, you will receive a call from my office staff regarding my recommendation for follow up.  BMI:  If you are age 27 or younger, your body mass index should be between 19-25. Your Body mass index is 27.63 kg/m. If this is out of the aformentioned range listed, please consider follow up with your Primary Care Provider.   MY CHART:  The [place] [facility] providers would like to encourage you to use [facility] to communicate with providers for non-urgent requests or questions.  Due to long hold times on the telephone, sending your provider a message by [facility] may be a faster and more efficient way to get a response.  Please allow 48 business hours for a response.  Please remember that this is for non-urgent requests.   Thank you for trusting me with your gastrointestinal care!    Tressia Danas, MD, MPH

## 2021-11-06 NOTE — Progress Notes (Signed)
Referring Provider: Hayden Rasmussen, MD Primary Care Physician:  Hayden Rasmussen, MD  Chief Complaint:  anal fissure, hemorrhoids, and pruritus ani   IMPRESSION:  Rectal itching and discomfort with rectal skin tags    - not explained by colonoscopy    - not responding to local therapy Symptoms of rectal prolapse Ileitis and right-sided colitis on colonoscopy    - acute symptoms on pathology without chronic features or pathognomonic features of IBD    - no improvement despite empiric Pentasa    - wishes to avoid medications as able + Shiga-toxin producing E coli  PLAN: - Continued HempCream PRN for local therapy - [facility] IBD Panel  - Avoid coffee, cola, beer, tomatoes, chocolate, tea, and citrus fruits as these can exacerbate anal itching  - Pelvic MRI - Low threshold for referral to surgery - Follow-up after the MRI  HPI: Kim Mccormick is a 36 y.o. female who returns in follow-up for persistent rectal symptoms. She was last seen 08/18/21. She returns today in scheduled follow-up.   Diagnosed with possible IBS at age 18, although she now thinks the symptoms were more related to nephrolithiasis. Experienced.GERD during pregnancy.  Reports an upper endoscopy around 1991 showing "inflamed mucosal folds" treated with H2blockers.   Seen in consultation 06/13/21 for rectal discomfort and itching not responding to OTC therapies, local Nitroglycerin and rectal suppositories. Has BRBPR after bowel movements.   Symptoms started with first of five vaginal pregnancies in 2009. Required Zofran during pregnancy that resulted in severe constipation and ultimately symptomatic hemorrhoids. Developed a fissure during her third pregnancy in 2013. No anorectal symptoms between pregnancies until her last pregnancy in 2020 when she had profoundly symptomatic hemorrhoids. Asymptomatic until February when symptoms recurred. Not responding to empiric antifungal treatment with tea tree oil. Prefers to  avoid medications.  She did not tolerate treatment with Nitroglycerin due to headaches.  Has normal formed bowel movements once or twice daily without straining. No mucous.  No constipation or diarrhea. No fecal soiling. However, she frequently feels like her colon lining needs to be put back into place.   Colonoscopy 06/23/21: - Perianal skin tags found on perianal exam. - Altered vascular and erythematous mucosa in the sigmoid colon, in the distal descending colon, in the proximal ascending colon and in the cecum. Biopsied. - Non-bleeding internal hemorrhoids. - Focally erythematous mucosa in the distal ileum. Biopsied. - The examination was otherwise normal on direct and retroflexion views.  Pathology results: - Acute ileitis without granulomas - Active colitis in the right colon without granulomas - Colon biopsies throughout the rest of the colon were normal.  GI pathogen panel + Shiga-toxin producing E coli Fecal calprotectin 48  Returns in follow-up with persistent rectal itching and stools with significant urgency. Occasional bleeding.  Symptoms worse at night. No change with empiric trial of Pentasa. Antiinflammatory paleo diet for 8 weeks has provided no change. She has only lost 5 pounds and feels like overall things aren't better with her GI symptoms, although her  arthralgias have improved.    Asking about surgery. Possible pregnancy.   Maternal aunt with colon cancer. No other family history of colon cancer or polyps.   Past Medical History:  Diagnosis Date   Complication of anesthesia    Kidney stone 2019   Medical history non-contributory     Past Surgical History:  Procedure Laterality Date   ESOPHAGOGASTRODUODENOSCOPY  1993   WISDOM TOOTH EXTRACTION      Current Outpatient  Medications  Medication Sig Dispense Refill   NON FORMULARY Boiron supp as needed     NON FORMULARY Hemp cream applied topically to rectum as needed     Current Facility-Administered  Medications  Medication Dose Route Frequency Provider Last Rate Last Admin   0.9 %  sodium chloride infusion  500 mL Intravenous Once Thornton Park, MD        Allergies as of 11/06/2021 - Review Complete 11/06/2021  Allergen Reaction Noted   Amoxil [amoxicillin] Rash 10/03/2012    Family History  Problem Relation Age of Onset   Colon cancer Maternal Aunt    Colon polyps Maternal Grandmother    Stomach cancer Neg Hx    Pancreatic cancer Neg Hx    Esophageal cancer Neg Hx    Rectal cancer Neg Hx     Physical Exam: Gen: Awake, alert, and oriented, and well communicative. HEENT: EOMI, non-icteric sclera, NCAT, MMM  Neck: Normal movement of head and neck  Pulm: No labored breathing, speaking in full sentences without conversational dyspnea  Derm: No apparent lesions or bruising in visible field  MS: Moves all visible extremities without noticeable abnormality  Psych: Pleasant, cooperative, normal speech, thought processing seemingly intact      Geza Beranek L. Tarri Glenn, MD, MPH 11/06/2021, 10:47 AM

## 2021-11-13 ENCOUNTER — Encounter: Payer: Self-pay | Admitting: Gastroenterology

## 2021-11-21 ENCOUNTER — Other Ambulatory Visit: Payer: Medicaid Other

## 2021-11-21 DIAGNOSIS — K529 Noninfective gastroenteritis and colitis, unspecified: Secondary | ICD-10-CM

## 2021-11-21 DIAGNOSIS — L290 Pruritus ani: Secondary | ICD-10-CM

## 2021-11-21 DIAGNOSIS — K625 Hemorrhage of anus and rectum: Secondary | ICD-10-CM

## 2021-11-21 DIAGNOSIS — K51919 Ulcerative colitis, unspecified with unspecified complications: Secondary | ICD-10-CM

## 2021-11-22 ENCOUNTER — Ambulatory Visit: Payer: Medicaid Other | Admitting: Gastroenterology

## 2021-11-23 LAB — INFLAMMATORY BOWEL DISEASE-IBD
Atypical pANCA: <1:20 {titer}
Saccharomyces cerevisiae, IgA: <20 Units (ref 0.0–24.9)
Saccharomyces cerevisiae, IgG: <20 Units (ref 0.0–24.9)

## 2021-11-24 ENCOUNTER — Other Ambulatory Visit: Payer: Self-pay

## 2021-11-24 ENCOUNTER — Ambulatory Visit (HOSPITAL_COMMUNITY)
Admission: RE | Admit: 2021-11-24 | Discharge: 2021-11-24 | Disposition: A | Discharge status: Home / Self Care | Payer: Medicaid Other | Source: Ambulatory Visit | Attending: Gastroenterology | Admitting: Gastroenterology

## 2021-11-24 DIAGNOSIS — K529 Noninfective gastroenteritis and colitis, unspecified: Secondary | ICD-10-CM | POA: Insufficient documentation

## 2021-11-24 DIAGNOSIS — K625 Hemorrhage of anus and rectum: Secondary | ICD-10-CM | POA: Diagnosis present

## 2021-11-24 DIAGNOSIS — L290 Pruritus ani: Secondary | ICD-10-CM | POA: Insufficient documentation

## 2021-11-24 DIAGNOSIS — K51919 Ulcerative colitis, unspecified with unspecified complications: Secondary | ICD-10-CM | POA: Diagnosis present

## 2021-11-24 MED ORDER — GADOBUTROL 1 MMOL/ML IV SOLN
7.0000 mL | Freq: Once | INTRAVENOUS | Status: AC | PRN
  Administered 2021-11-24: 7 mL via INTRAVENOUS

## 2021-11-27 ENCOUNTER — Encounter: Payer: Self-pay | Admitting: Gastroenterology

## 2021-11-28 ENCOUNTER — Other Ambulatory Visit: Payer: Self-pay

## 2021-11-28 DIAGNOSIS — K6289 Other specified diseases of anus and rectum: Secondary | ICD-10-CM

## 2021-11-28 DIAGNOSIS — L290 Pruritus ani: Secondary | ICD-10-CM

## 2022-09-18 IMAGING — MR MR PELVIS WO/W CM
6 of 9 series · 29 of 48 positions shown · IV contrast (gadavist)
Comparison: None.

CLINICAL DATA: Ulcerative colitis.  Rectal itching and bleeding.

EXAM:
MRI PELVIS WITHOUT AND WITH CONTRAST
TECHNIQUE: Multiplanar multisequence MR imaging of the pelvis was performed
both before and after administration of intravenous contrast.
CONTRAST:  7mL GADAVIST GADOBUTROL 1 MMOL/ML IV SOLN

[Series 2: T2 · sagittal · 3.5mm · 1.09mm/px · 7 of 43 slices shown (1 of 2)]
[im 1/43]
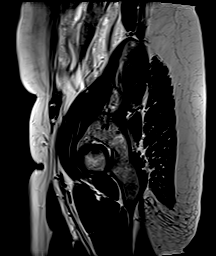
[im 8/43]
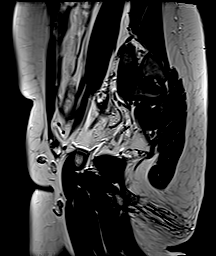
[im 15/43]
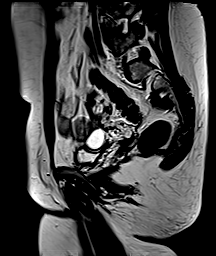
[im 22/43]
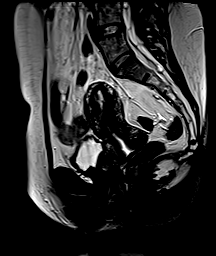
[im 29/43]
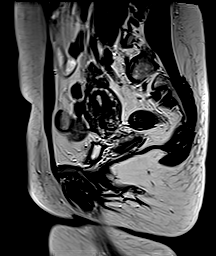
[im 36/43]
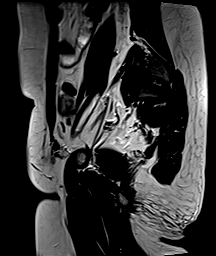
[im 43/43]
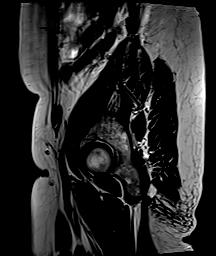

[Series 3: T2 fat-sat · sagittal · 3.5mm · 1.02mm/px · 7 of 43 slices shown (1 of 3)]
[im 1/43]
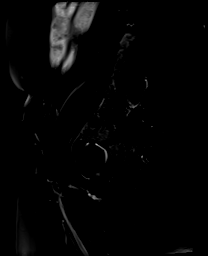
[im 8/43]
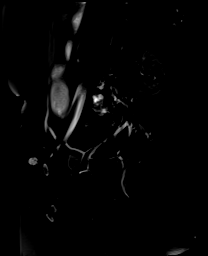
[im 15/43]
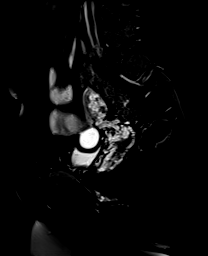
[im 22/43]
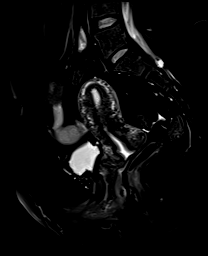
[im 29/43]
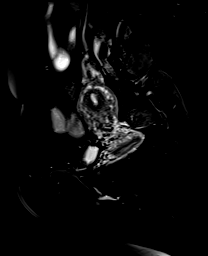
[im 36/43]
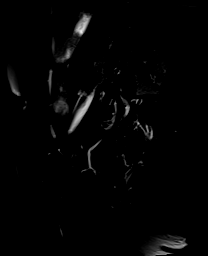
[im 43/43]
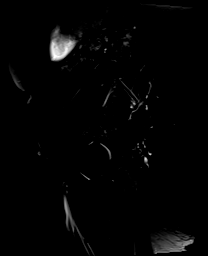

[Series 4: T1 · axial · 4.0mm · 0.43mm/px · z∈[-144,+24]mm · 4 of 34 slices shown]
[im 1/34]
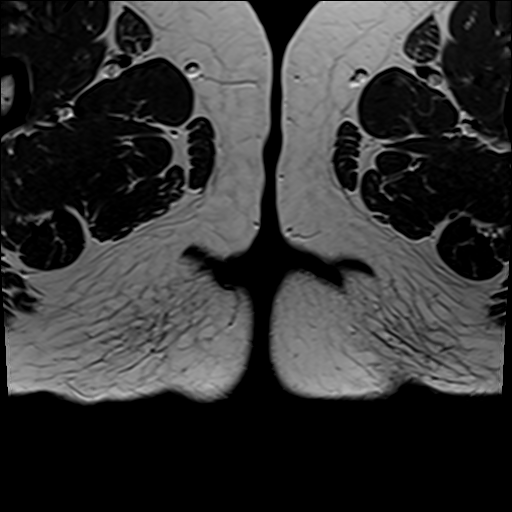
[im 12/34]
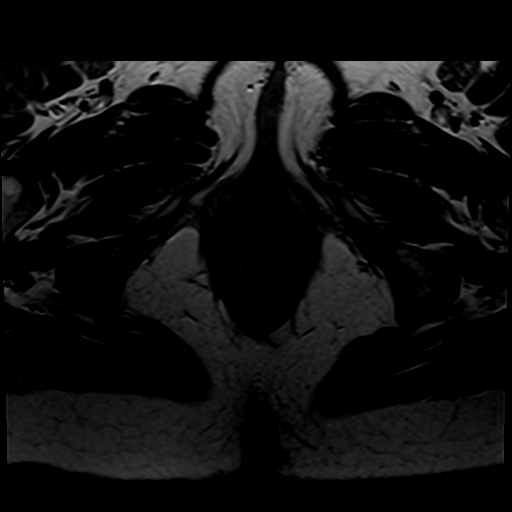
[im 23/34]
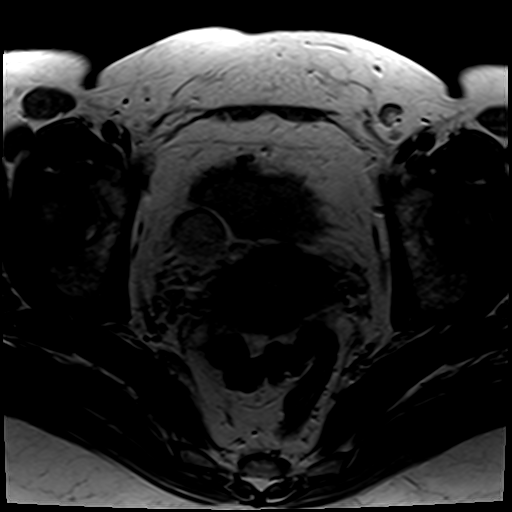
[im 34/34]
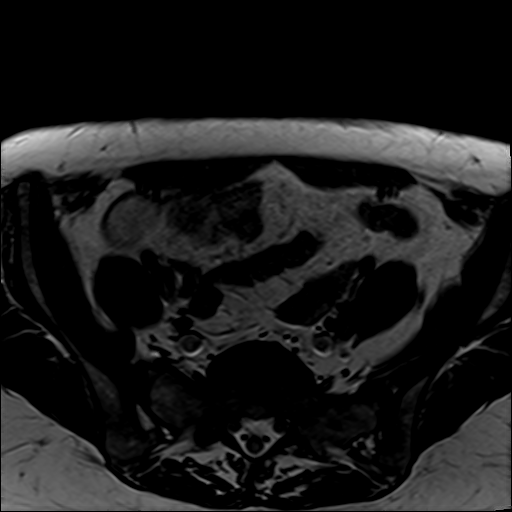

[Series 5: T2 · axial · 4.0mm · 0.43mm/px · z∈[-144,+24]mm · 4 of 34 slices shown (2 of 2)]
[im 1/34]
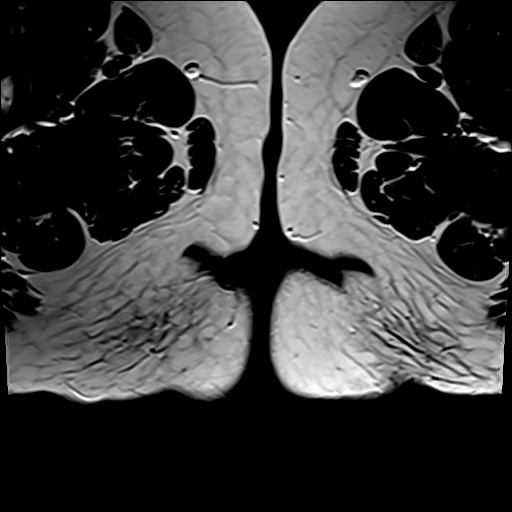
[im 12/34]
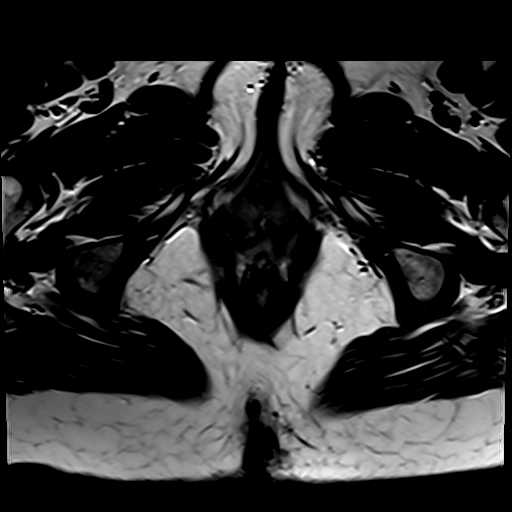
[im 23/34]
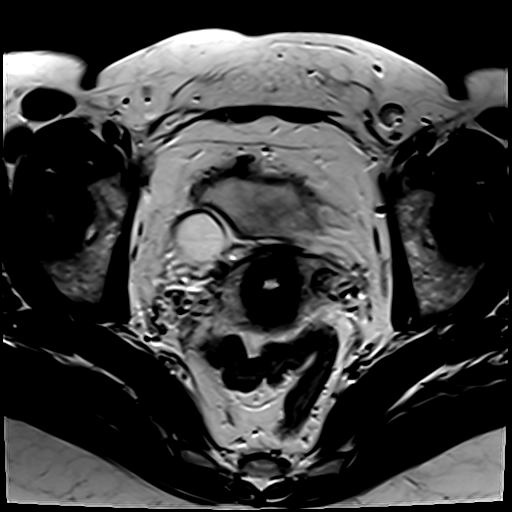
[im 34/34]
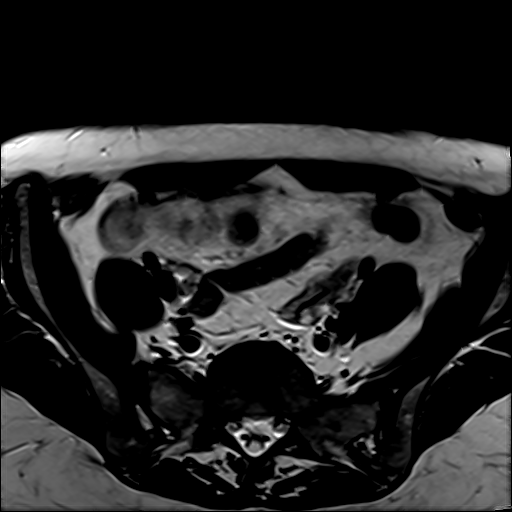

[Series 6: T2 fat-sat · axial · 4.0mm · 0.43mm/px · z∈[-144,+24]mm · 4 of 34 slices shown (2 of 3)]
[im 1/34]
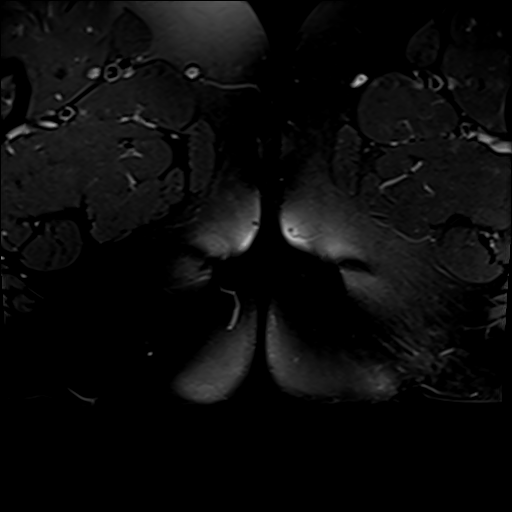
[im 12/34]
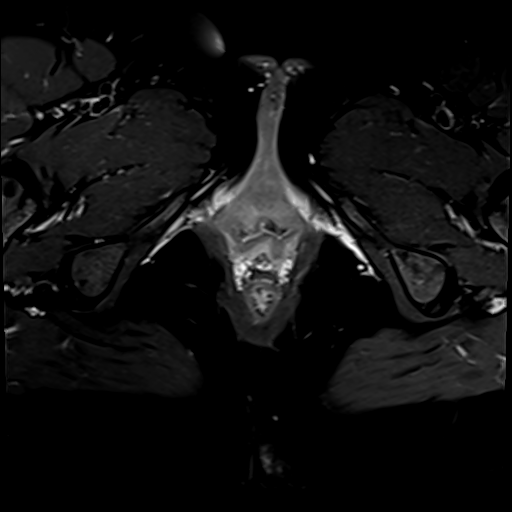
[im 23/34]
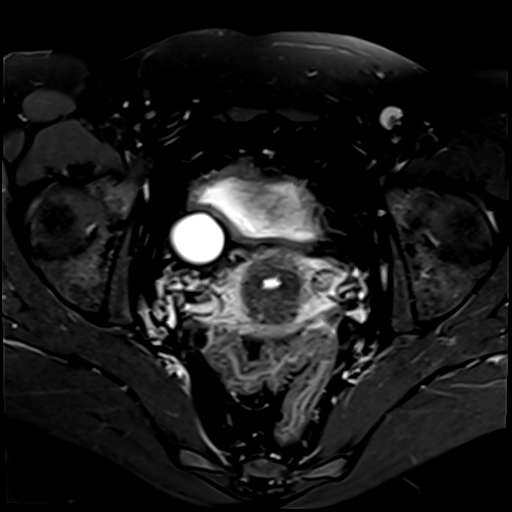
[im 34/34]
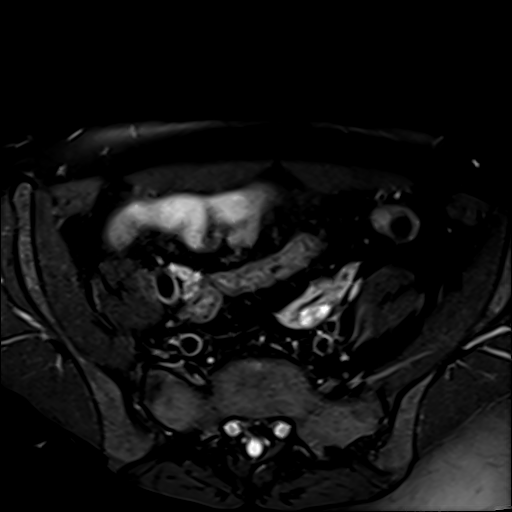

[Series 7: T2 fat-sat · coronal · 4.0mm · 0.47mm/px · 3 of 34 slices shown (3 of 3)]
[im 1/34]
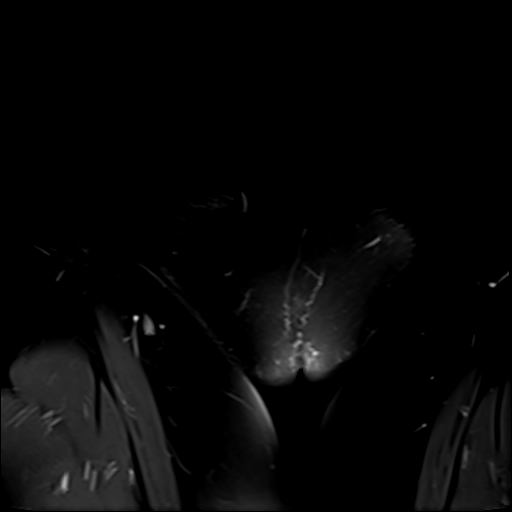
[im 12/34]
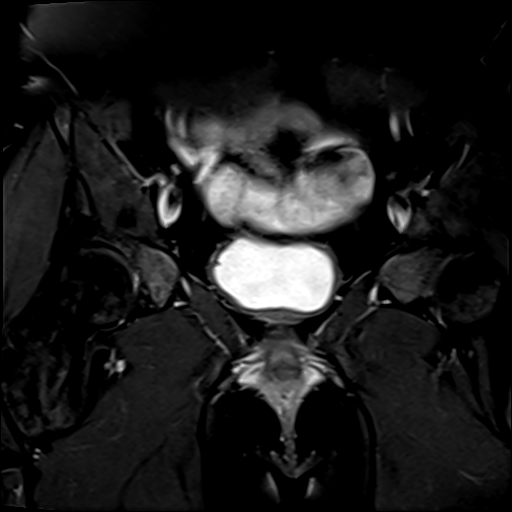
[im 23/34]
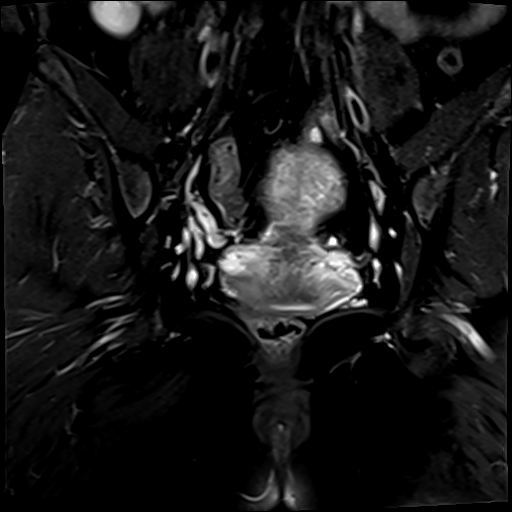

[29 of 48 positions shown; findings below may reference images not displayed]

FINDINGS: Urinary Tract:  No abnormality visualized.

Bowel: Unremarkable visualized pelvic bowel loops. No wall
thickening in the rectum or anus. No perirectal or perianal edema or
inflammation. No evidence for perianal fistula. No perianal abscess.

Vascular/Lymphatic: No pathologically enlarged lymph nodes. No
significant vascular abnormality seen.

Reproductive: Uterus unremarkable. Junctional zone well preserved
throughout. Endometrium is 9 mm thickness without evidence for
endometrial mass or fluid collection.

Right ovary measures 3.0 x 1.8 x 3.0 cm.

Left ovary is not well seen, likely high in the pelvis, near the
level of the uterine fundus. No left adnexal mass.

2.5 x 2.1 x 2.2 cm simple cyst is identified in the right pelvis,
discrete from and anterior to the right kidney inferior to the broad
ligament in generating some mass-effect on the bladder.

Other:  Very trace free fluid is seen in the cul-de-sac.

Musculoskeletal: No focal suspicious marrow enhancement within the
visualized bony anatomy.
IMPRESSION: 1. No evidence for perianal fistula or abscess. No rectal wall
thickening. No perianal edema/inflammation.
2. 2.5 cm simple cyst in the right pelvis, discrete from and
anterior to the right kidney, generating some mass-effect on the
bladder. This is likely a paraovarian cyst. Pelvic ultrasound
recommended to further evaluate and establish baseline for follow-up
imaging.
3. Very trace free fluid in the cul-de-sac.

## 2024-05-15 ENCOUNTER — Other Ambulatory Visit: Payer: Self-pay | Admitting: Medical Genetics

## 2024-06-18 ENCOUNTER — Other Ambulatory Visit

## 2024-06-18 DIAGNOSIS — Z006 Encounter for examination for normal comparison and control in clinical research program: Secondary | ICD-10-CM

## 2024-06-30 LAB — GENECONNECT MOLECULAR SCREEN: Genetic Analysis Overall Interpretation: POSITIVE — AB

## 2024-07-01 ENCOUNTER — Telehealth: Payer: Self-pay | Admitting: Medical Genetics

## 2024-07-01 DIAGNOSIS — Z1505 Genetic susceptibility to malignant neoplasm of fallopian tube(s): Secondary | ICD-10-CM

## 2024-07-06 DIAGNOSIS — Z1505 Genetic susceptibility to malignant neoplasm of fallopian tube(s): Secondary | ICD-10-CM | POA: Insufficient documentation

## 2024-07-06 NOTE — Telephone Encounter (Signed)
 [facility] Positive Result Note 07/06/2024 2:07 PM  THIRD ATTEMPT: Confirmed I was speaking with Kim Mccormick [phone] by using name and DOB. Informed participant the reason for this call is to provide results for the above study. Results revealed Hereditary Breast and Ovarian Syndrome. Genetic counseling was offered and participant requests a call back to schedule. All questions were answered, and participant was thanked for their time and support of the above study. Participant was encouraged to contact [facility] if they have any further questions or concerns.

## 2024-07-07 ENCOUNTER — Telehealth: Payer: Self-pay | Admitting: Medical Genetics

## 2024-07-21 NOTE — Telephone Encounter (Signed)
 07/21/2024 1:43PM  Attempted to contact participant 3 times to schedule [facility] genetic counseling appointment. Participant may call back at anytime to schedule.   Jordyn Pennstrom, BS [place]  [facility] Clinical Research Specialist II Direct Dial: [phone]  Fax: [phone]
